# Patient Record
Sex: Male | Born: 1975 | Race: White | Hispanic: No | Marital: Married | Smoking: Never smoker
Health system: Southern US, Community
[De-identification: ages and names within clinical notes are randomized; demographics above are authoritative.]

## PROBLEM LIST (undated history)

## (undated) HISTORY — PX: APPENDECTOMY: SHX54

---

## 2016-08-21 ENCOUNTER — Emergency Department (HOSPITAL_COMMUNITY)
Admission: EM | Admit: 2016-08-21 | Discharge: 2016-08-22 | Disposition: A | Discharge status: Home / Self Care | Payer: Self-pay | Attending: Emergency Medicine | Admitting: Emergency Medicine

## 2016-08-21 ENCOUNTER — Emergency Department (HOSPITAL_COMMUNITY): Payer: Self-pay

## 2016-08-21 ENCOUNTER — Encounter (HOSPITAL_COMMUNITY): Payer: Self-pay | Admitting: *Deleted

## 2016-08-21 DIAGNOSIS — K047 Periapical abscess without sinus: Secondary | ICD-10-CM

## 2016-08-21 DIAGNOSIS — E860 Dehydration: Secondary | ICD-10-CM

## 2016-08-21 LAB — CBC WITH DIFFERENTIAL/PLATELET
BASOS ABS: 0 10*3/uL (ref 0.0–0.1)
BASOS PCT: 0 %
EOS PCT: 0 %
Eosinophils Absolute: 0 10*3/uL (ref 0.0–0.7)
HEMATOCRIT: 40.9 % (ref 39.0–52.0)
Hemoglobin: 14.9 g/dL (ref 13.0–17.0)
Lymphocytes Relative: 8 %
Lymphs Abs: 0.7 10*3/uL (ref 0.7–4.0)
MCH: 29.2 pg (ref 26.0–34.0)
MCHC: 36.4 g/dL — ABNORMAL HIGH (ref 30.0–36.0)
MCV: 80.2 fL (ref 78.0–100.0)
MONO ABS: 0.7 10*3/uL (ref 0.1–1.0)
Monocytes Relative: 8 %
NEUTROS ABS: 7.8 10*3/uL — AB (ref 1.7–7.7)
Neutrophils Relative %: 84 %
PLATELETS: 125 10*3/uL — AB (ref 150–400)
RBC: 5.1 MIL/uL (ref 4.22–5.81)
RDW: 12.2 % (ref 11.5–15.5)
WBC: 9.3 10*3/uL (ref 4.0–10.5)

## 2016-08-21 LAB — COMPREHENSIVE METABOLIC PANEL
ALBUMIN: 4.2 g/dL (ref 3.5–5.0)
ALT: 38 U/L (ref 17–63)
AST: 31 U/L (ref 15–41)
Alkaline Phosphatase: 71 U/L (ref 38–126)
Anion gap: 10 (ref 5–15)
BUN: 13 mg/dL (ref 6–20)
CHLORIDE: 99 mmol/L — AB (ref 101–111)
CO2: 23 mmol/L (ref 22–32)
Calcium: 9.2 mg/dL (ref 8.9–10.3)
Creatinine, Ser: 1.08 mg/dL (ref 0.61–1.24)
GFR calc Af Amer: >60 mL/min (ref >60)
GFR calc non Af Amer: >60 mL/min (ref >60)
GLUCOSE: 110 mg/dL — AB (ref 65–99)
POTASSIUM: 3.6 mmol/L (ref 3.5–5.1)
SODIUM: 132 mmol/L — AB (ref 135–145)
Total Bilirubin: 1.6 mg/dL — ABNORMAL HIGH (ref 0.3–1.2)
Total Protein: 7 g/dL (ref 6.5–8.1)

## 2016-08-21 LAB — I-STAT CG4 LACTIC ACID, ED: Lactic Acid, Venous: 1.39 mmol/L (ref 0.5–1.9)

## 2016-08-21 MED ORDER — CLINDAMYCIN PHOSPHATE 900 MG/50ML IV SOLN
900.0000 mg | Freq: Once | INTRAVENOUS | Status: AC
  Administered 2016-08-21: 900 mg via INTRAVENOUS
  Filled 2016-08-21: qty 50

## 2016-08-21 MED ORDER — IOPAMIDOL (ISOVUE-300) INJECTION 61%
INTRAVENOUS | Status: AC
  Administered 2016-08-21: 75 mL
  Filled 2016-08-21: qty 75

## 2016-08-21 MED ORDER — ACETAMINOPHEN 325 MG PO TABS
650.0000 mg | ORAL_TABLET | Freq: Once | ORAL | Status: DC
Start: 2016-08-21 — End: 2016-08-21

## 2016-08-21 MED ORDER — SODIUM CHLORIDE 0.9 % IV BOLUS (SEPSIS)
1000.0000 mL | Freq: Once | INTRAVENOUS | Status: AC
  Administered 2016-08-21: 1000 mL via INTRAVENOUS

## 2016-08-21 MED ORDER — KETOROLAC TROMETHAMINE 15 MG/ML IJ SOLN
15.0000 mg | Freq: Once | INTRAMUSCULAR | Status: AC
  Administered 2016-08-21: 15 mg via INTRAVENOUS
  Filled 2016-08-21: qty 1

## 2016-08-21 MED ORDER — ACETAMINOPHEN 325 MG PO TABS
650.0000 mg | ORAL_TABLET | Freq: Once | ORAL | Status: AC
  Administered 2016-08-21: 650 mg via ORAL
  Filled 2016-08-21: qty 2

## 2016-08-21 NOTE — ED Notes (Signed)
Spoke with Dr Silverio LayYao.  He feels dizziness is r/t fever and not a stroke.  Sepsis labs to be ordered.

## 2016-08-21 NOTE — ED Notes (Signed)
Pt given water to drink. 

## 2016-08-21 NOTE — ED Provider Notes (Signed)
[facility]-EMERGENCY DEPT Provider Note   CSN: [id number] Arrival date & time: 08/21/16  1740     History   Chief Complaint Chief Complaint  Patient presents with  . Dizziness    HPI Hall BusingJorge Ihde is a 40 y.o. male.  HPI   40 year old male with no significant past medical history who presents with fever, chills, and syncopal episode. The patient had a right molar extracted yesterday due to dental infection and pain. He states that upon returning home, he took his pain medication, antibiotic, and went to sleep. Upon standing, he felt generally weak, lightheaded, and states that he passed out. He woke up on the ground several hours later. He then noticed that he began to have a fever to 103 along with myalgias and general malaise. He reports a mild, aching, throbbing right sided tooth pain that is mildly improved today. Denies any preceding or subsequent chest pain or shortness of breath. No focal numbness or weakness. No history of syncope or cardiac disease.  History reviewed. No pertinent past medical history.  There are no active problems to display for this patient.   Past Surgical History:  Procedure Laterality Date  . APPENDECTOMY         Home Medications    Prior to Admission medications   Medication Sig Start Date End Date Taking? Authorizing Provider  amoxicillin (AMOXIL) 500 MG capsule Take 500 mg by mouth every 8 (eight) hours. 7 day course started 08/20/16   Yes Historical Provider, MD  HYDROcodone-acetaminophen (NORCO) 7.5-325 MG tablet Take 1 tablet by mouth every 6 (six) hours as needed (pain).   Yes Historical Provider, MD  clindamycin (CLEOCIN) 300 MG capsule Take 1 capsule (300 mg total) by mouth 4 (four) times daily. 08/22/16 09/01/16  Shaune Pollackameron Edrie Ehrich, MD    Family History No family history on file.  Social History Social History  Substance Use Topics  . Smoking status: Never Smoker  . Smokeless tobacco: Never Used  . Alcohol use Yes     Comment: occ      Allergies   Patient has no known allergies.   Review of Systems Review of Systems  Constitutional: Positive for chills and fever. Negative for fatigue.  HENT: Positive for dental problem. Negative for congestion, rhinorrhea, trouble swallowing and voice change.   Eyes: Negative for visual disturbance.  Respiratory: Negative for cough, shortness of breath and wheezing.   Cardiovascular: Negative for chest pain and leg swelling.  Gastrointestinal: Negative for abdominal pain, diarrhea, nausea and vomiting.  Genitourinary: Negative for dysuria and flank pain.  Musculoskeletal: Negative for neck pain and neck stiffness.  Skin: Negative for rash and wound.  Allergic/Immunologic: Negative for immunocompromised state.  Neurological: Positive for syncope and weakness. Negative for headaches.  All other systems reviewed and are negative.    Physical Exam Updated Vital Signs BP 108/71   Pulse (!) 51   Temp 102.5 F (39.2 C)   Resp 20   Wt 185 lb 3 oz (84 kg)   SpO2 98%   Physical Exam  Constitutional: He is oriented to person, place, and time. He appears well-developed and well-nourished. No distress.  HENT:  Head: Normocephalic and atraumatic.  Dry mucous membranes. Multiple dental caries with diffusely poor dentition. Right upper molar is status post extraction, with mild erythema but no appreciable gingival abscess. No drainage. No facial swelling or erythema. Tympanic membranes normal bilaterally.  Eyes: Conjunctivae are normal.  Neck: Neck supple.  Cardiovascular: Normal rate, regular rhythm and normal heart  sounds.  Exam reveals no friction rub.   No murmur heard. Pulmonary/Chest: Effort normal and breath sounds normal. No respiratory distress. He has no wheezes. He has no rales.  Abdominal: He exhibits no distension.  Musculoskeletal: He exhibits no edema.  Neurological: He is alert and oriented to person, place, and time. He exhibits normal muscle tone.  Skin: Skin  is warm. Capillary refill takes less than 2 seconds.  Psychiatric: He has a normal mood and affect.  Nursing note and vitals reviewed.    ED Treatments / Results  Labs (all labs ordered are listed, but only abnormal results are displayed) Labs Reviewed  CBC WITH DIFFERENTIAL/PLATELET - Abnormal; Notable for the following:       Result Value   MCHC 36.4 (*)    Platelets 125 (*)    Neutro Abs 7.8 (*)    All other components within normal limits  COMPREHENSIVE METABOLIC PANEL - Abnormal; Notable for the following:    Sodium 132 (*)    Chloride 99 (*)    Glucose, Bld 110 (*)    Total Bilirubin 1.6 (*)    All other components within normal limits  CULTURE, BLOOD (ROUTINE X 2)  CULTURE, BLOOD (ROUTINE X 2)  I-STAT CG4 LACTIC ACID, ED    EKG  EKG Interpretation  Date/Time:  Friday August 21 2016 18:28:55 EST Ventricular Rate:  86 PR Interval:  148 QRS Duration: 96 QT Interval:  348 QTC Calculation: 416 R Axis:   105 Text Interpretation:  Normal sinus rhythm Rightward axis Borderline ECG No significant change since last tracing Confirmed by Tommy Minichiello MD, Sheria Lang [phone]) on 08/22/2016 1:48:07 AM       Radiology Ct Head Wo Contrast  Result Date: 08/21/2016 CLINICAL DATA:  Initial evaluation for acute facial pain, recent tooth extraction. EXAM: CT HEAD WITHOUT CONTRAST TECHNIQUE: Contiguous axial images were obtained from the base of the skull through the vertex without intravenous contrast. COMPARISON:  None available. FINDINGS: Brain: Cerebral volume normal. No acute intracranial hemorrhage. No evidence for acute large vessel territory infarct. No mass lesion, midline shift, or mass effect. No hydrocephalus. No extra-axial fluid collection. Vascular: No hyperdense vessel. Skull: Scalp soft tissues within normal limits.  Calvarium intact. Sinuses/Orbits: Globes and orbits within normal limits. Paranasal sinuses better evaluated on concomitant maxillofacial CT. No mastoid effusion.  IMPRESSION: Negative head CT.  No acute intracranial process identified. Electronically Signed   By: Rise Mu M.D.   On: 08/21/2016 21:52   Ct Maxillofacial W Contrast  Result Date: 08/21/2016 CLINICAL DATA:  Initial evaluation for acute facial pain, recent right-sided tooth extraction. EXAM: CT MAXILLOFACIAL WITH CONTRAST TECHNIQUE: Multidetector CT imaging of the maxillofacial structures was performed with intravenous contrast. Multiplanar CT image reconstructions were also generated. A small metallic BB was placed on the right temple in order to reliably differentiate right from left. CONTRAST:  75mL ISOVUE-300 IOPAMIDOL (ISOVUE-300) INJECTION 61% COMPARISON:  None. FINDINGS: Osseous: Patient appears to be status post recent dental extraction of a right maxillary molar. Soft tissue opacity fills the extraction site, with mild extension along the buccal aspect of the right maxilla (series 6, image 43). There is dehiscence of both the buccal and lingual aspects of the alveolar ridge. No discrete abscess or drainable fluid collection. There is mild swelling with inflammatory stranding within the adjacent right masticator space, likely at least in part postoperative in nature. Superimposed infection/cellulitis not excluded. Extension into the right retro antral fat. No discrete abscess or drainable fluid collection identified.  Orbits: Globes and orbits within normal limits without acute abnormality. Sinuses: Right maxillary sinus is completely opacified. Expansile lucency with probable dehiscence at the alveolar ridge at the site of molar tooth extraction at the floor of the right maxilla (series 12, image 43). Scattered mucosal thickening noted within the ethmoidal air cells and right sphenoid sinus is well. No air-fluid levels to suggest active sinus infection. Mastoid air cells are clear. Middle ear cavities are clear. Soft tissues: Soft tissue swelling within the right face as above. No  pathologically enlarged adenopathy within the visualized neck. Visualized pharynx within normal limits. Limited intracranial: Visualized brain within normal limits without acute abnormality. IMPRESSION: 1. Sequela of recent right maxillary molar tooth extraction as above. Swelling with mild soft tissue stranding within the adjacent right masticator space is most certainly at least in part postoperative, although superimposed infection/cellulitis could be considered in the correct clinical setting. No abscess or drainable fluid collection identified. 2. Right maxillary sinus disease. Electronically Signed   By: Rise MuBenjamin  McClintock M.D.   On: 08/21/2016 22:05    Procedures Procedures (including critical care time)  Medications Ordered in ED Medications  sodium chloride 0.9 % bolus 1,000 mL (0 mLs Intravenous Stopped 08/21/16 2047)  ketorolac (TORADOL) 15 MG/ML injection 15 mg (15 mg Intravenous Given 08/21/16 1920)  acetaminophen (TYLENOL) tablet 650 mg (650 mg Oral Given 08/21/16 1913)  clindamycin (CLEOCIN) IVPB 900 mg (0 mg Intravenous Stopped 08/21/16 2030)  iopamidol (ISOVUE-300) 61 % injection (75 mLs  Contrast Given 08/21/16 2033)  sodium chloride 0.9 % bolus 1,000 mL (0 mLs Intravenous Stopped 08/22/16 0042)     Initial Impression / Assessment and Plan / ED Course  I have reviewed the triage vital signs and the nursing notes.  Pertinent labs & imaging results that were available during my care of the patient were reviewed by me and considered in my medical decision making (see chart for details).  Clinical Course as of Aug 22 1009  Sat Aug 22, 2016  1008 Specimen Description: BLOOD LEFT ARM [CI]    Clinical Course User Index [CI] Shaune Pollackameron Titiana Severa, MD    40 yo M with no significant PMHx here with dizziness, orthostatic syncope s/p dental extraction. On arrival, pt febrile, but otherwise HDS. BP 100-120 systolic which is his baseline. Non-toxic on exam. Labs show normal WBC, normal  LA, normal renal function. CT obtained 2/2 recent procedure and shows dental cellulitis/infection, without abscess. No intracranial extension.   Suspect fever 2/2 recent extraction with active dental infection, with syncope 2/2 orthostasis. Syncope occurred after taking meds and standing up and had classic prodrome. EKG non-ischemic with normal intervals. Doubt cardiac etiology. No focal neuro deficits. Pt markedly well appearing and sx resolved with IVF. IV Clindamycin also given. Given well appearance, normal vitals, and well-appearance, will d/c with clinda, outpt f/u, good return precautions.  Final Clinical Impressions(s) / ED Diagnoses   Final diagnoses:  Dental infection  Dehydration    New Prescriptions Discharge Medication List as of 08/22/2016 12:22 AM    START taking these medications   Details  clindamycin (CLEOCIN) 300 MG capsule Take 1 capsule (300 mg total) by mouth 4 (four) times daily., Starting Sat 08/22/2016, Until Tue 09/01/2016, Print         Shaune Pollackameron Priyana Mccarey, MD 08/22/16 1010

## 2016-08-21 NOTE — ED Triage Notes (Signed)
[place].  Tooth broke on Sunday and pt had it extracted yesterday.  When he went back to hotel, he took pain meds and antibiotics, and felt dizzy and, passed out, waking up on ground.  Pt woke up at 4 am and has not taken any more meds, but still feels dizzy.  Temp of 103 oral in triage.

## 2016-08-22 MED ORDER — CLINDAMYCIN HCL 300 MG PO CAPS: 300.0000 mg | ORAL_CAPSULE | Freq: Four times a day (QID) | ORAL | 0 refills | Status: AC

## 2016-08-26 LAB — CULTURE, BLOOD (ROUTINE X 2)
CULTURE: NO GROWTH
CULTURE: NO GROWTH

## 2017-12-10 IMAGING — CT CT MAXILLOFACIAL W/ CM
3 of 7 series · 15 of 47 positions shown, 18 images · IV contrast (iopamidol)
Comparison: None.

CLINICAL DATA: Initial evaluation for acute facial pain, recent
right-sided tooth extraction.

EXAM:
CT MAXILLOFACIAL WITH CONTRAST
TECHNIQUE: Multidetector CT imaging of the maxillofacial structures was
performed with intravenous contrast. Multiplanar CT image
reconstructions were also generated. A small metallic BB was placed
on the right temple in order to reliably differentiate right from
left.
CONTRAST:  75mL 9PSIB0-S22 IOPAMIDOL (9PSIB0-S22) INJECTION 61%

[Series 4: head 3.0 mpr · coronal · 0.32mm/px · 2 of 70 slices shown (1 of 2)]
[im 24/70  bone]
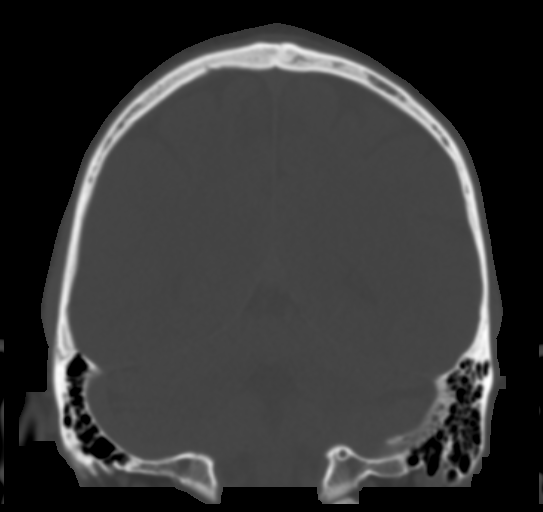
[im 47/70  bone]
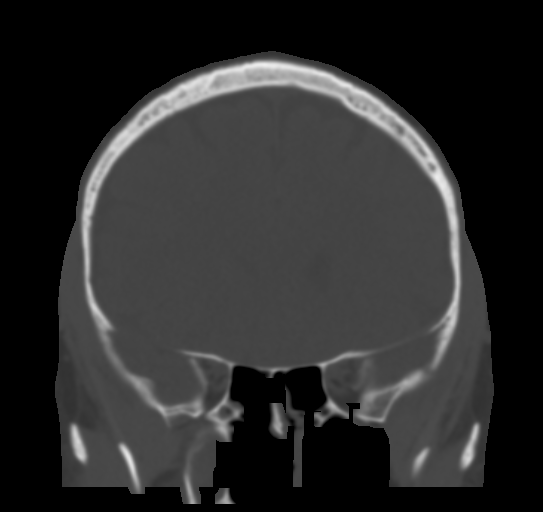

[Series 5: head 3.0 mpr · sagittal · 0.32mm/px · 1 of 54 slices shown (2 of 2)]
[im 27/54  bone]
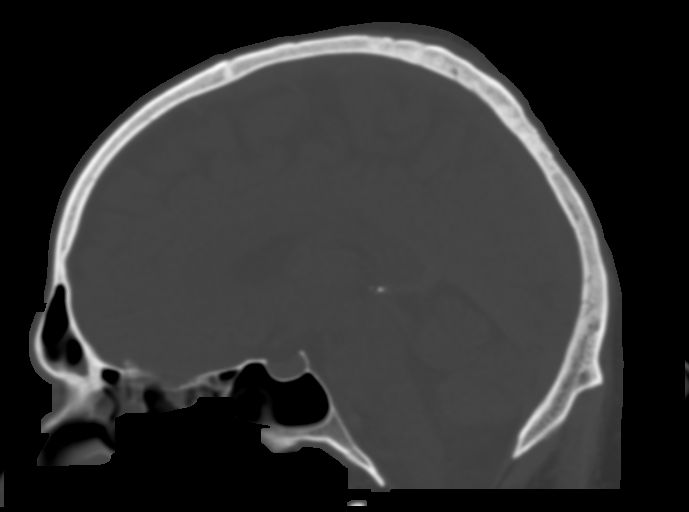

[Series 6: facial/ orbits 2.0 h30s · axial · 0.37mm/px · z∈[+1181,+1341]mm · 12 of 96 slices shown, 15 images]
[im 8/96  brain]
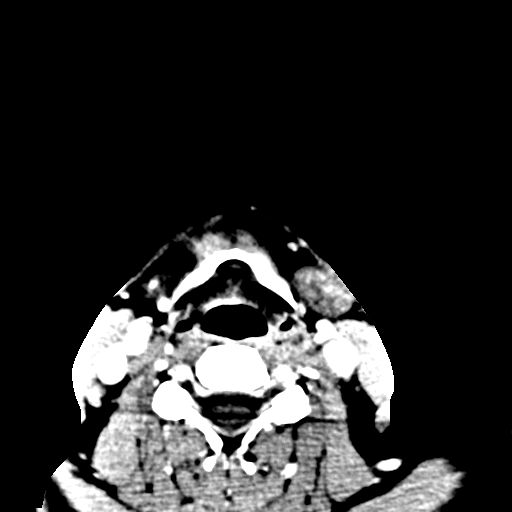
[im 8/96  bone]
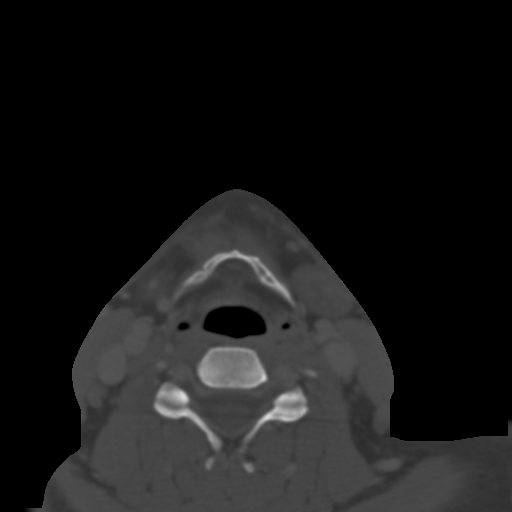
[im 15/96  bone]
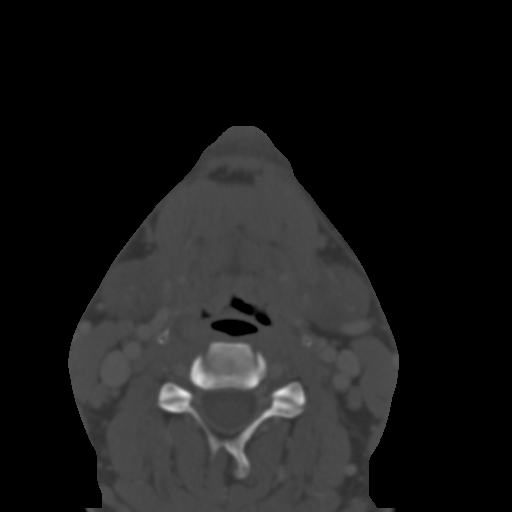
[im 22/96  bone]
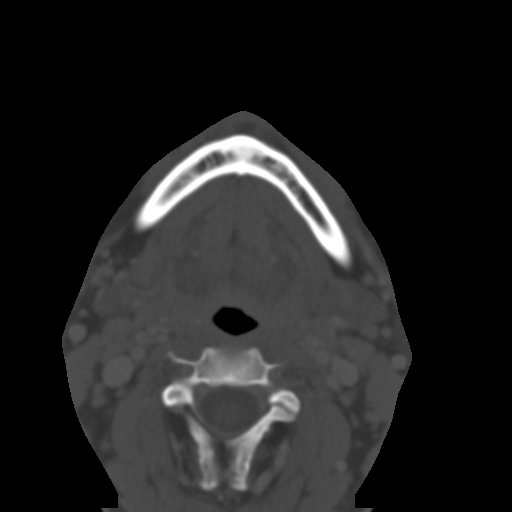
[im 30/96  bone]
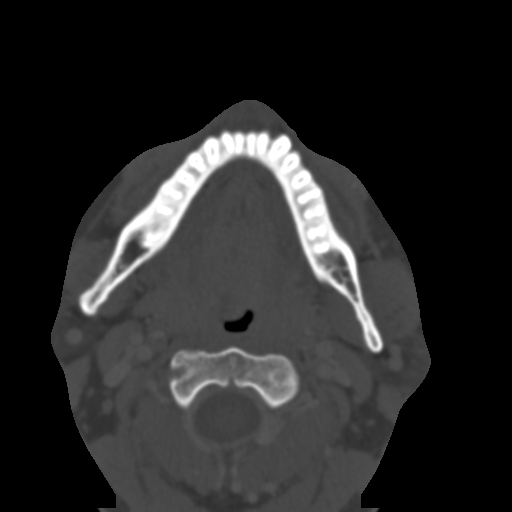
[im 37/96  brain]
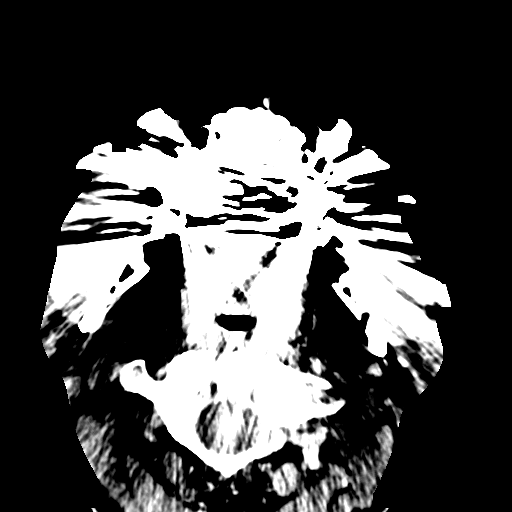
[im 37/96  bone]
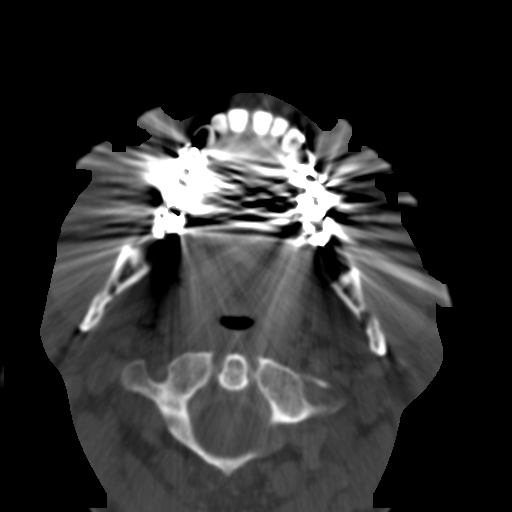
[im 44/96  bone]
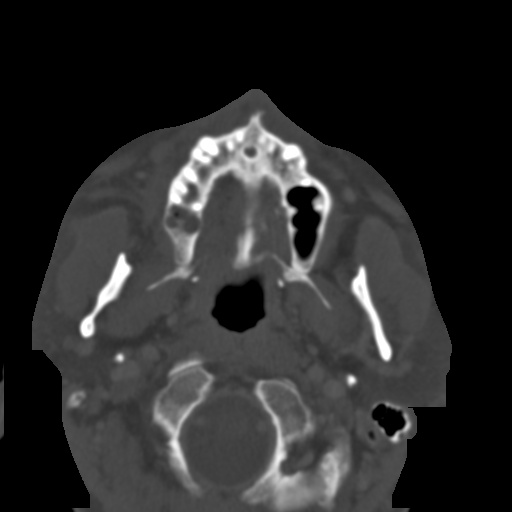
[im 52/96  bone]
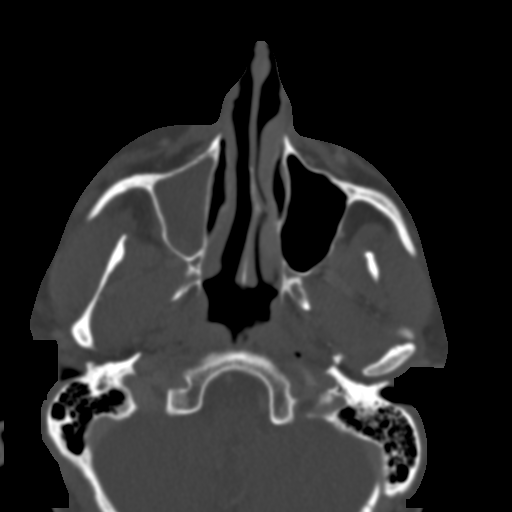
[im 59/96  bone]
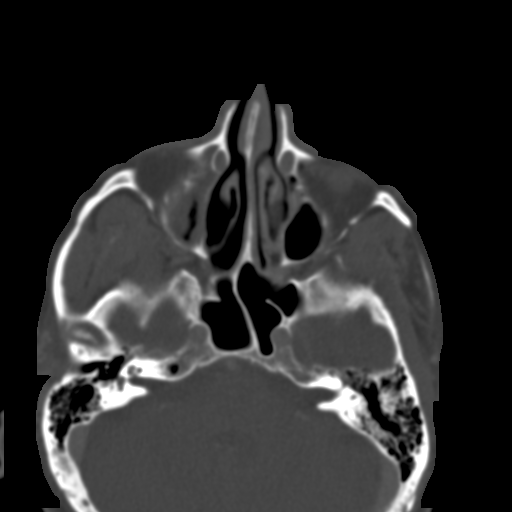
[im 66/96  brain]
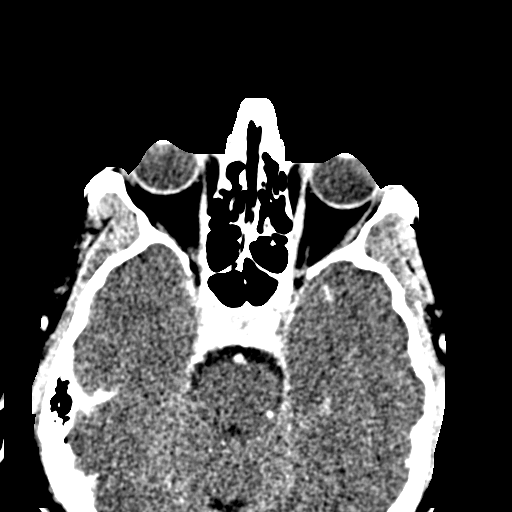
[im 66/96  bone]
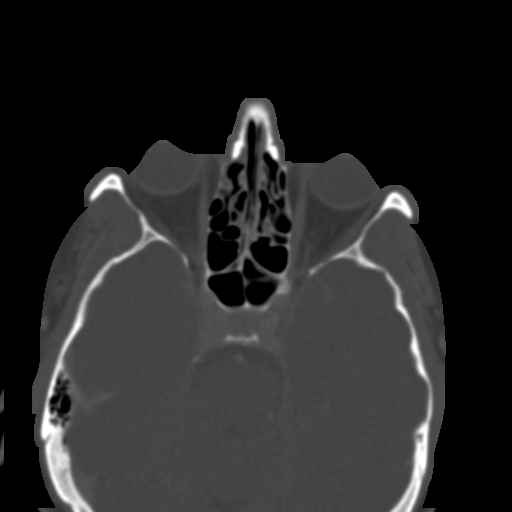
[im 74/96  bone]
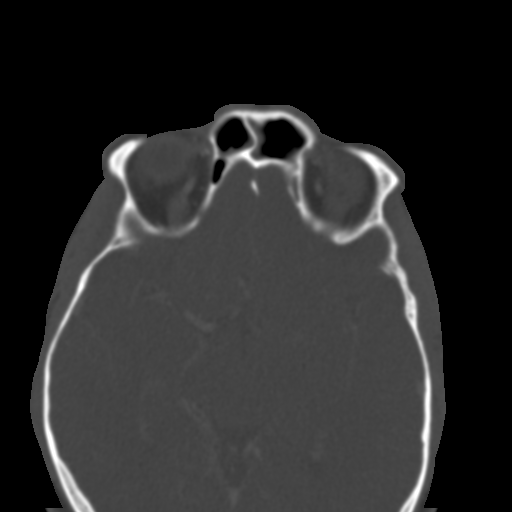
[im 81/96  bone]
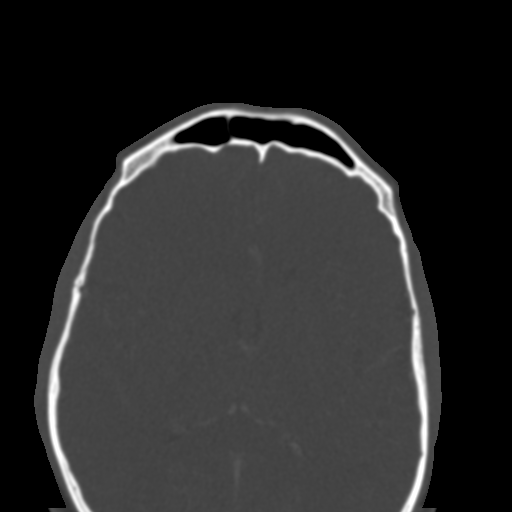
[im 88/96  bone]
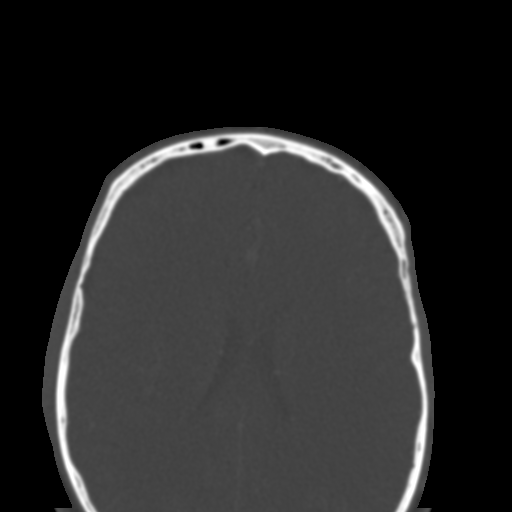

[15 of 47 positions shown; findings below may reference images not displayed]

FINDINGS: Osseous: Patient appears to be status post recent dental extraction
of a right maxillary molar. Soft tissue opacity fills the extraction
site, with mild extension along the buccal aspect of the right
maxilla (series 6, image 43). There is dehiscence of both the buccal
and lingual aspects of the alveolar ridge. No discrete abscess or
drainable fluid collection. There is mild swelling with inflammatory
stranding within the adjacent right masticator space, likely at
least in part postoperative in nature. Superimposed
infection/cellulitis not excluded. Extension into the right retro
antral fat. No discrete abscess or drainable fluid collection
identified.

Orbits: Globes and orbits within normal limits without acute
abnormality.

Sinuses: Right maxillary sinus is completely opacified. Expansile
lucency with probable dehiscence at the alveolar ridge at the site
of molar tooth extraction at the floor of the right maxilla (series
12, image 43). Scattered mucosal thickening noted within the
ethmoidal air cells and right sphenoid sinus is well. No air-fluid
levels to suggest active sinus infection. Mastoid air cells are
clear. Middle ear cavities are clear.

Soft tissues: Soft tissue swelling within the right face as above.
No pathologically enlarged adenopathy within the visualized neck.
Visualized pharynx within normal limits.

Limited intracranial: Visualized brain within normal limits without
acute abnormality.
IMPRESSION: 1. Sequela of recent right maxillary molar tooth extraction as
above. Swelling with mild soft tissue stranding within the adjacent
right masticator space is most certainly at least in part
postoperative, although superimposed infection/cellulitis could be
considered in the correct clinical setting. No abscess or drainable
fluid collection identified.
2. Right maxillary sinus disease.
